# Patient Record
Sex: Male | Born: 1940 | Race: White | Hispanic: No | Marital: Married | Smoking: Never smoker
Health system: Southern US, Community
[De-identification: ages and names within clinical notes are randomized; demographics above are authoritative.]

## PROBLEM LIST (undated history)

## (undated) HISTORY — PX: TONSILLECTOMY: SUR1361

## (undated) HISTORY — PX: SHOULDER SURGERY: SHX246

## (undated) HISTORY — PX: HERNIA REPAIR: SHX51

---

## 2005-08-04 ENCOUNTER — Ambulatory Visit: Payer: Self-pay | Admitting: Pain Medicine

## 2005-10-06 ENCOUNTER — Ambulatory Visit: Payer: Self-pay | Admitting: Pain Medicine

## 2005-10-17 ENCOUNTER — Ambulatory Visit: Payer: Self-pay | Admitting: Pain Medicine

## 2005-10-20 ENCOUNTER — Ambulatory Visit: Payer: Self-pay | Admitting: Pain Medicine

## 2005-11-03 ENCOUNTER — Ambulatory Visit: Payer: Self-pay | Admitting: Pain Medicine

## 2007-11-26 ENCOUNTER — Encounter: Admission: RE | Admit: 2007-11-26 | Discharge: 2007-11-26 | Payer: Self-pay | Admitting: Family Medicine

## 2007-11-26 ENCOUNTER — Encounter (INDEPENDENT_AMBULATORY_CARE_PROVIDER_SITE_OTHER): Payer: Self-pay | Admitting: Radiology

## 2010-10-18 ENCOUNTER — Encounter: Payer: Self-pay | Admitting: Cardiovascular Disease

## 2010-10-18 ENCOUNTER — Ambulatory Visit: Payer: Medicare Other | Admitting: Cardiovascular Disease

## 2010-10-18 ENCOUNTER — Encounter: Payer: Medicare Other | Admitting: Cardiovascular Disease

## 2010-12-13 ENCOUNTER — Other Ambulatory Visit: Payer: Self-pay | Admitting: Neurology

## 2010-12-13 DIAGNOSIS — G733 Myasthenic syndromes in other diseases classified elsewhere: Secondary | ICD-10-CM

## 2010-12-15 ENCOUNTER — Other Ambulatory Visit: Payer: Medicare Other

## 2010-12-15 ENCOUNTER — Ambulatory Visit
Admission: RE | Admit: 2010-12-15 | Discharge: 2010-12-15 | Disposition: A | Discharge status: Home / Self Care | Payer: Medicare Other | Source: Ambulatory Visit | Attending: Neurology | Admitting: Neurology

## 2010-12-15 DIAGNOSIS — G733 Myasthenic syndromes in other diseases classified elsewhere: Secondary | ICD-10-CM

## 2010-12-15 MED ORDER — IOHEXOL 300 MG/ML  SOLN
75.0000 mL | Freq: Once | INTRAMUSCULAR | Status: AC | PRN
  Administered 2010-12-15: 75 mL via INTRAVENOUS

## 2011-05-31 NOTE — Progress Notes (Signed)
Patient ID: Chad Griffith, male   DOB: 10-13-1940, 70 y.o.   MRN: [id number]

## 2012-06-10 IMAGING — CT CT CHEST W/ CM
2 of 3 series · 15 of 36 positions shown, 18 images · IV contrast (75CC OMNI 300)
Comparison: None.

***ADDENDUM*** CREATED: 03/03/2011 [DATE]

BUN and creatinine were obtained on site at [HOSPITAL] at
[HOSPITAL].
Results:  BUN 19 mg/dL,  Creatinine 1.0 mg/dL.
***END ADDENDUM*** SIGNED BY: Mozeylian Ahad Talukder, M.D.
CLINICAL DATA: Myasthenia symptoms.  Question thymoma.  Double
vision.
CT CHEST WITH CONTRAST
TECHNIQUE: Multidetector CT imaging of the chest was performed
following the standard protocol during bolus administration of
intravenous contrast.
Contrast: 75 ml Omnipaque-UYY.

[Series 100: routine chest · axial · 0.70mm/px · z∈[-261,-6]mm · 12 of 61 slices shown, 15 images]
[im 5/61  mediastinal]
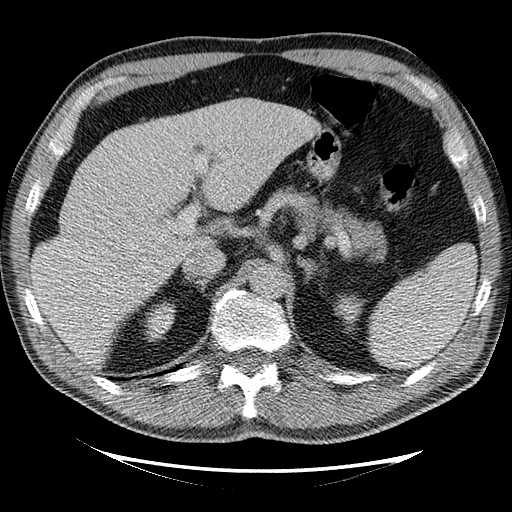
[im 5/61  lung]
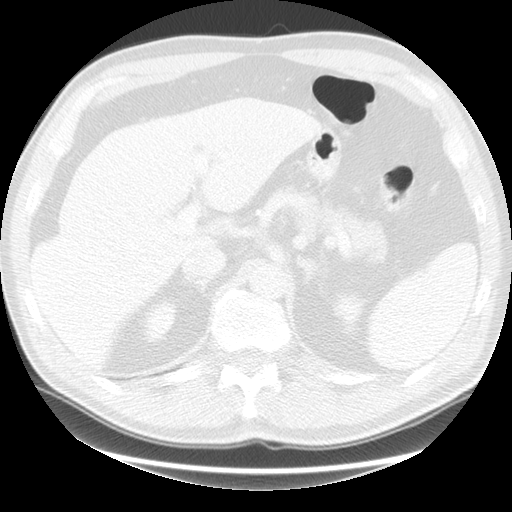
[im 9/61  lung]
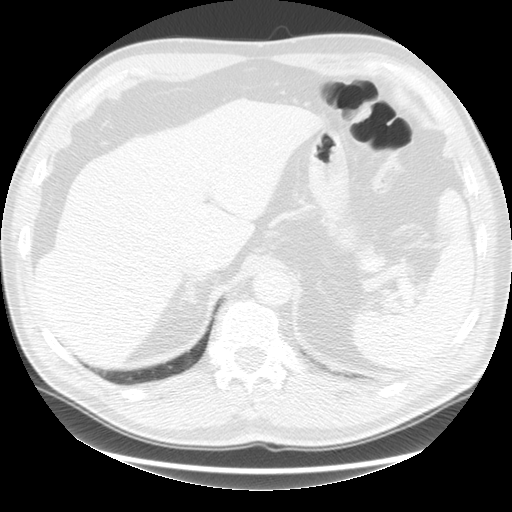
[im 14/61  lung]
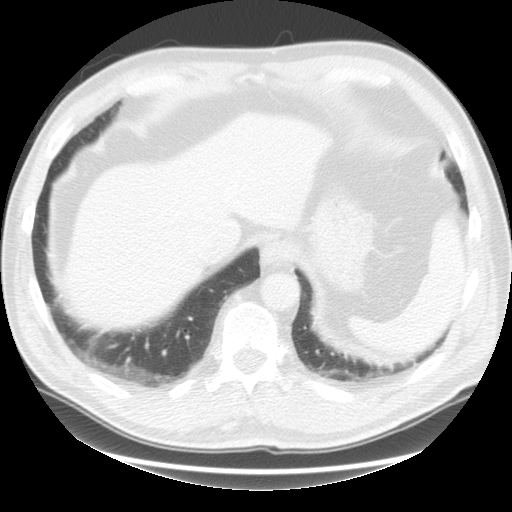
[im 18/61  lung]
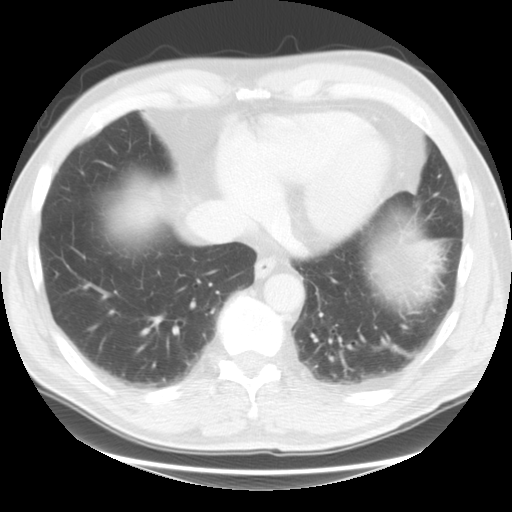
[im 23/61  mediastinal]
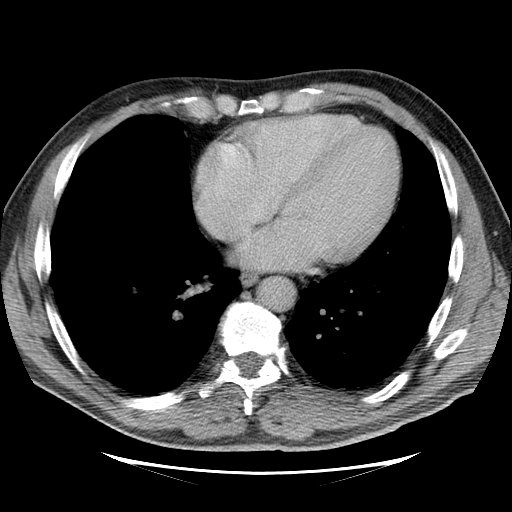
[im 23/61  lung]
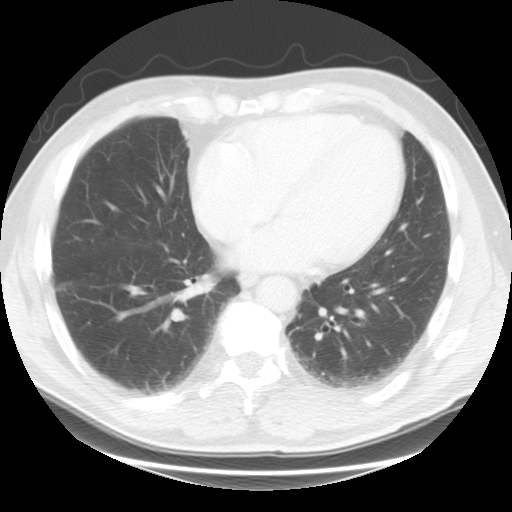
[im 27/61  lung]
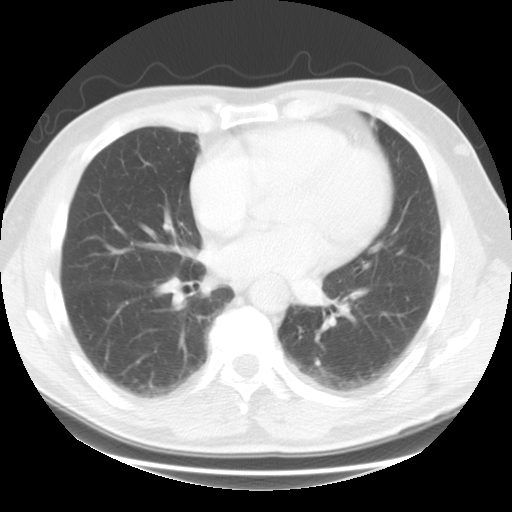
[im 34/61  lung]
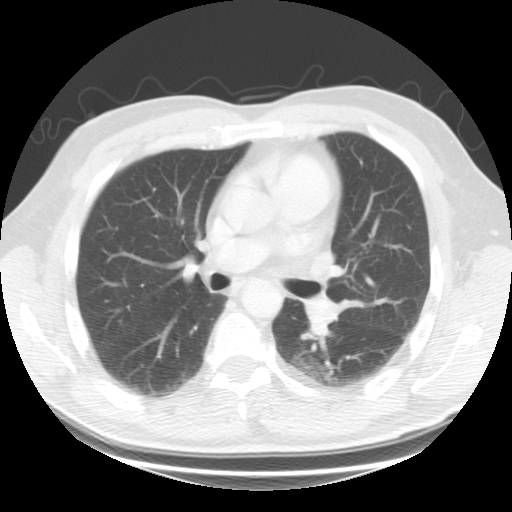
[im 38/61  lung]
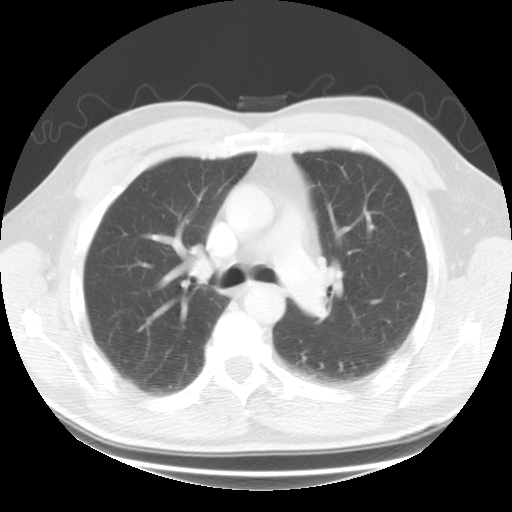
[im 43/61  mediastinal]
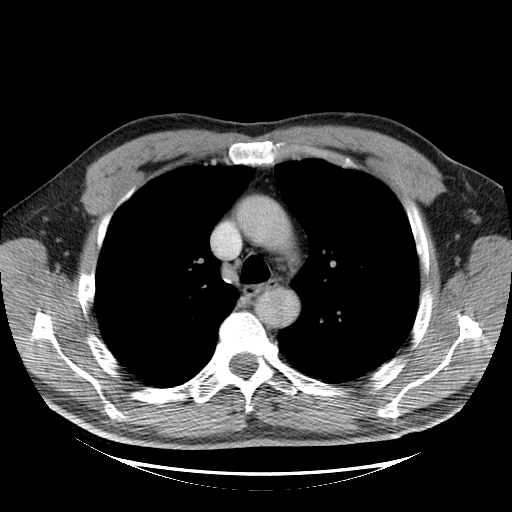
[im 43/61  lung]
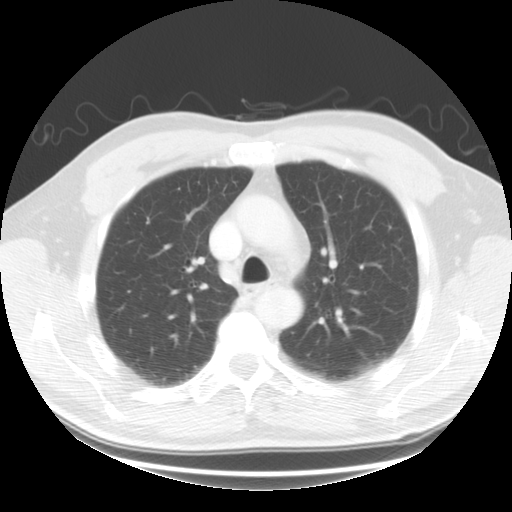
[im 47/61  lung]
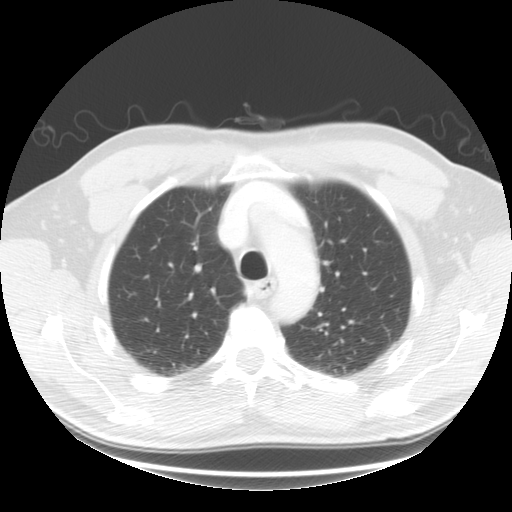
[im 52/61  lung]
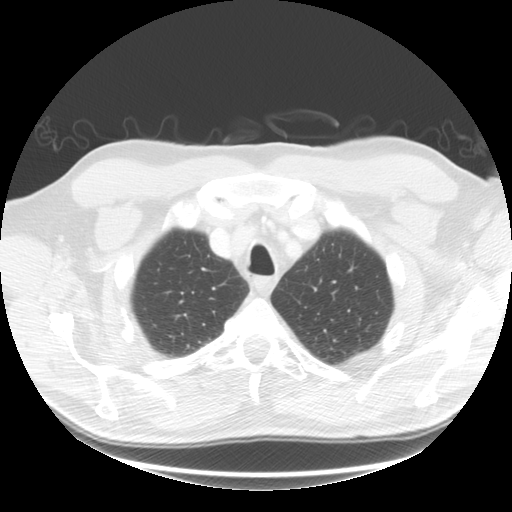
[im 56/61  lung]
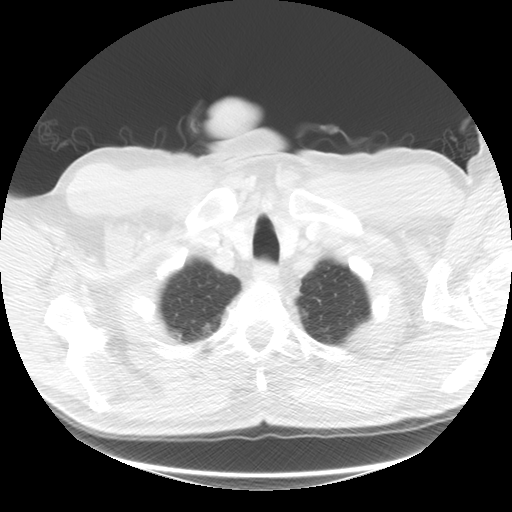

[Series 103: cor · coronal · 0.70mm/px · 3 of 124 slices shown]
[im 25/124  lung]
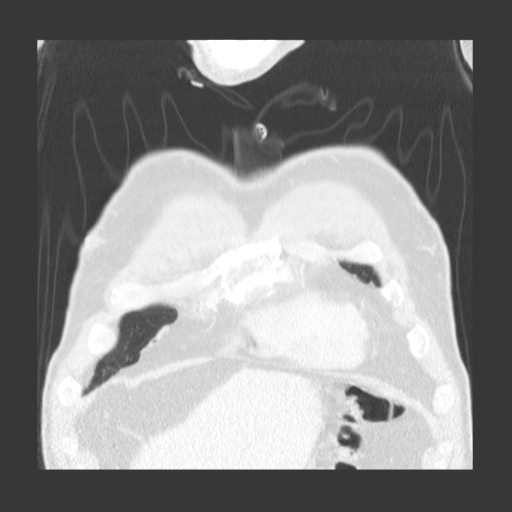
[im 50/124  lung]
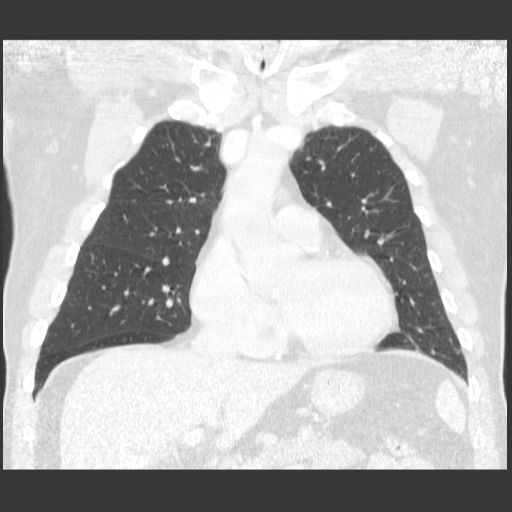
[im 74/124  lung]
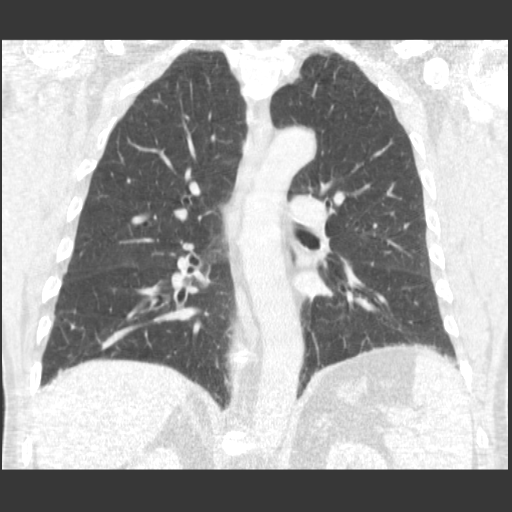

[15 of 36 positions shown; findings below may reference images not displayed]

FINDINGS: No findings of thymoma.

Heart is slightly enlarged. Trace pericardial fluid.  Coronary
artery calcifications.

Structure adjacent to the anterior border of the left main
pulmonary artery probably represents the left atrial appendage with
mild pulsation artifact rather than adenopathy.  Overall, no
evidence of mediastinal or hilar adenopathy.

Scattered pulmonary parenchymal changes suggestive of
scarring/atelectasis.

Within the left chest wall inferior and lateral to the nipple is a
1.2 cm subcutaneous lesion (series 100 image 35).
Etiology/significance indeterminate.

Scoliosis without bony destructive lesion.

Slightly lobulated contour of the liver. Otherwise upper abdominal
structures unremarkable.
IMPRESSION: No evidence of thymoma.

Scattered pulmonary parenchymal changes suggestive of
scarring/subsegmental atelectatic changes.

Mild coronary artery calcifications.  Heart slightly enlarged

Within the left chest wall inferior and lateral to the nipple is a
1.2 cm subcutaneous lesion (series 100 image 35).
Etiology/significance indeterminate.

Scoliosis without bony destructive lesion.

## 2012-12-24 DIAGNOSIS — G7000 Myasthenia gravis without (acute) exacerbation: Secondary | ICD-10-CM | POA: Insufficient documentation

## 2012-12-24 HISTORY — DX: Myasthenia gravis without (acute) exacerbation: G70.00

## 2013-05-10 DIAGNOSIS — K5730 Diverticulosis of large intestine without perforation or abscess without bleeding: Secondary | ICD-10-CM

## 2013-05-10 DIAGNOSIS — H2510 Age-related nuclear cataract, unspecified eye: Secondary | ICD-10-CM

## 2013-05-10 DIAGNOSIS — N320 Bladder-neck obstruction: Secondary | ICD-10-CM

## 2013-05-10 DIAGNOSIS — R39198 Other difficulties with micturition: Secondary | ICD-10-CM | POA: Insufficient documentation

## 2013-05-10 DIAGNOSIS — N401 Enlarged prostate with lower urinary tract symptoms: Secondary | ICD-10-CM

## 2013-05-10 DIAGNOSIS — I498 Other specified cardiac arrhythmias: Secondary | ICD-10-CM | POA: Insufficient documentation

## 2013-05-10 DIAGNOSIS — Z860100 Personal history of colon polyps, unspecified: Secondary | ICD-10-CM

## 2013-05-10 HISTORY — DX: Other specified cardiac arrhythmias: I49.8

## 2013-05-10 HISTORY — DX: Other difficulties with micturition: R39.198

## 2013-05-10 HISTORY — DX: Bladder-neck obstruction: N32.0

## 2013-05-10 HISTORY — DX: Diverticulosis of large intestine without perforation or abscess without bleeding: K57.30

## 2013-05-10 HISTORY — DX: Benign prostatic hyperplasia with lower urinary tract symptoms: N40.1

## 2013-05-10 HISTORY — DX: Personal history of colon polyps, unspecified: Z86.0100

## 2013-05-10 HISTORY — DX: Age-related nuclear cataract, unspecified eye: H25.10

## 2014-03-24 DIAGNOSIS — Z862 Personal history of diseases of the blood and blood-forming organs and certain disorders involving the immune mechanism: Secondary | ICD-10-CM

## 2014-03-24 DIAGNOSIS — M62121 Other rupture of muscle (nontraumatic), right upper arm: Secondary | ICD-10-CM

## 2014-03-24 DIAGNOSIS — Z5181 Encounter for therapeutic drug level monitoring: Secondary | ICD-10-CM | POA: Insufficient documentation

## 2014-03-24 DIAGNOSIS — Z79631 Long term (current) use of antimetabolite agent: Secondary | ICD-10-CM

## 2014-03-24 HISTORY — DX: Personal history of diseases of the blood and blood-forming organs and certain disorders involving the immune mechanism: Z86.2

## 2014-03-24 HISTORY — DX: Long term (current) use of antimetabolite agent: Z79.631

## 2014-03-24 HISTORY — DX: Other rupture of muscle (nontraumatic), right upper arm: M62.121

## 2014-03-29 DIAGNOSIS — E538 Deficiency of other specified B group vitamins: Secondary | ICD-10-CM

## 2014-03-29 HISTORY — DX: Deficiency of other specified B group vitamins: E53.8

## 2016-09-23 DIAGNOSIS — Z7982 Long term (current) use of aspirin: Secondary | ICD-10-CM | POA: Diagnosis not present

## 2016-09-23 DIAGNOSIS — G7000 Myasthenia gravis without (acute) exacerbation: Secondary | ICD-10-CM | POA: Diagnosis not present

## 2016-09-23 DIAGNOSIS — H532 Diplopia: Secondary | ICD-10-CM | POA: Diagnosis not present

## 2016-09-23 DIAGNOSIS — Z79899 Other long term (current) drug therapy: Secondary | ICD-10-CM | POA: Diagnosis not present

## 2016-09-23 DIAGNOSIS — Z5181 Encounter for therapeutic drug level monitoring: Secondary | ICD-10-CM | POA: Diagnosis not present

## 2016-09-23 DIAGNOSIS — E538 Deficiency of other specified B group vitamins: Secondary | ICD-10-CM | POA: Diagnosis not present

## 2017-02-03 DIAGNOSIS — R8299 Other abnormal findings in urine: Secondary | ICD-10-CM | POA: Diagnosis not present

## 2017-02-03 DIAGNOSIS — E785 Hyperlipidemia, unspecified: Secondary | ICD-10-CM | POA: Diagnosis not present

## 2017-02-03 DIAGNOSIS — Z7982 Long term (current) use of aspirin: Secondary | ICD-10-CM | POA: Diagnosis not present

## 2017-02-03 DIAGNOSIS — R112 Nausea with vomiting, unspecified: Secondary | ICD-10-CM | POA: Diagnosis not present

## 2017-02-03 DIAGNOSIS — N201 Calculus of ureter: Secondary | ICD-10-CM | POA: Diagnosis not present

## 2017-02-03 DIAGNOSIS — R1110 Vomiting, unspecified: Secondary | ICD-10-CM | POA: Diagnosis not present

## 2017-02-03 DIAGNOSIS — R1032 Left lower quadrant pain: Secondary | ICD-10-CM | POA: Diagnosis not present

## 2017-02-05 DIAGNOSIS — E785 Hyperlipidemia, unspecified: Secondary | ICD-10-CM | POA: Diagnosis not present

## 2017-02-05 DIAGNOSIS — G7000 Myasthenia gravis without (acute) exacerbation: Secondary | ICD-10-CM | POA: Diagnosis not present

## 2017-02-05 DIAGNOSIS — N200 Calculus of kidney: Secondary | ICD-10-CM | POA: Diagnosis not present

## 2017-02-05 DIAGNOSIS — R1032 Left lower quadrant pain: Secondary | ICD-10-CM | POA: Diagnosis not present

## 2017-02-05 DIAGNOSIS — N202 Calculus of kidney with calculus of ureter: Secondary | ICD-10-CM | POA: Diagnosis not present

## 2017-02-05 DIAGNOSIS — N201 Calculus of ureter: Secondary | ICD-10-CM | POA: Diagnosis not present

## 2017-02-05 DIAGNOSIS — N179 Acute kidney failure, unspecified: Secondary | ICD-10-CM | POA: Diagnosis not present

## 2017-02-05 DIAGNOSIS — K5900 Constipation, unspecified: Secondary | ICD-10-CM | POA: Diagnosis not present

## 2017-02-06 DIAGNOSIS — N200 Calculus of kidney: Secondary | ICD-10-CM | POA: Diagnosis not present

## 2017-02-06 DIAGNOSIS — R319 Hematuria, unspecified: Secondary | ICD-10-CM | POA: Diagnosis not present

## 2017-02-06 DIAGNOSIS — N23 Unspecified renal colic: Secondary | ICD-10-CM | POA: Diagnosis not present

## 2017-02-06 DIAGNOSIS — Z6831 Body mass index (BMI) 31.0-31.9, adult: Secondary | ICD-10-CM | POA: Diagnosis not present

## 2017-02-06 DIAGNOSIS — N529 Male erectile dysfunction, unspecified: Secondary | ICD-10-CM | POA: Diagnosis not present

## 2017-02-06 DIAGNOSIS — M549 Dorsalgia, unspecified: Secondary | ICD-10-CM | POA: Diagnosis not present

## 2017-02-06 DIAGNOSIS — K8020 Calculus of gallbladder without cholecystitis without obstruction: Secondary | ICD-10-CM | POA: Diagnosis not present

## 2017-02-06 DIAGNOSIS — R310 Gross hematuria: Secondary | ICD-10-CM | POA: Diagnosis not present

## 2017-02-08 DIAGNOSIS — N200 Calculus of kidney: Secondary | ICD-10-CM | POA: Diagnosis not present

## 2017-02-08 DIAGNOSIS — R1032 Left lower quadrant pain: Secondary | ICD-10-CM | POA: Diagnosis not present

## 2017-02-08 DIAGNOSIS — N401 Enlarged prostate with lower urinary tract symptoms: Secondary | ICD-10-CM | POA: Diagnosis not present

## 2017-02-09 DIAGNOSIS — N200 Calculus of kidney: Secondary | ICD-10-CM | POA: Diagnosis not present

## 2017-04-19 DIAGNOSIS — E7800 Pure hypercholesterolemia, unspecified: Secondary | ICD-10-CM | POA: Diagnosis not present

## 2017-04-19 DIAGNOSIS — Z23 Encounter for immunization: Secondary | ICD-10-CM | POA: Diagnosis not present

## 2017-04-19 DIAGNOSIS — Z7689 Persons encountering health services in other specified circumstances: Secondary | ICD-10-CM | POA: Diagnosis not present

## 2017-04-19 DIAGNOSIS — G7000 Myasthenia gravis without (acute) exacerbation: Secondary | ICD-10-CM | POA: Diagnosis not present

## 2017-09-01 DIAGNOSIS — Z7982 Long term (current) use of aspirin: Secondary | ICD-10-CM | POA: Diagnosis not present

## 2017-09-01 DIAGNOSIS — E7800 Pure hypercholesterolemia, unspecified: Secondary | ICD-10-CM | POA: Diagnosis not present

## 2017-09-07 DIAGNOSIS — R221 Localized swelling, mass and lump, neck: Secondary | ICD-10-CM | POA: Diagnosis not present

## 2017-09-07 DIAGNOSIS — L720 Epidermal cyst: Secondary | ICD-10-CM | POA: Diagnosis not present

## 2017-09-22 DIAGNOSIS — L720 Epidermal cyst: Secondary | ICD-10-CM | POA: Diagnosis not present

## 2017-09-22 DIAGNOSIS — L578 Other skin changes due to chronic exposure to nonionizing radiation: Secondary | ICD-10-CM | POA: Diagnosis not present

## 2017-09-22 DIAGNOSIS — I499 Cardiac arrhythmia, unspecified: Secondary | ICD-10-CM | POA: Diagnosis not present

## 2017-09-22 DIAGNOSIS — Z7982 Long term (current) use of aspirin: Secondary | ICD-10-CM | POA: Diagnosis not present

## 2017-09-22 DIAGNOSIS — E785 Hyperlipidemia, unspecified: Secondary | ICD-10-CM | POA: Diagnosis not present

## 2017-09-22 DIAGNOSIS — R221 Localized swelling, mass and lump, neck: Secondary | ICD-10-CM | POA: Diagnosis not present

## 2017-09-22 DIAGNOSIS — G7000 Myasthenia gravis without (acute) exacerbation: Secondary | ICD-10-CM | POA: Diagnosis not present

## 2017-10-27 DIAGNOSIS — Z7982 Long term (current) use of aspirin: Secondary | ICD-10-CM | POA: Diagnosis not present

## 2017-10-27 DIAGNOSIS — Z79899 Other long term (current) drug therapy: Secondary | ICD-10-CM | POA: Diagnosis not present

## 2017-10-27 DIAGNOSIS — G7000 Myasthenia gravis without (acute) exacerbation: Secondary | ICD-10-CM | POA: Diagnosis not present

## 2017-10-27 DIAGNOSIS — E538 Deficiency of other specified B group vitamins: Secondary | ICD-10-CM | POA: Diagnosis not present

## 2017-10-27 DIAGNOSIS — Z5181 Encounter for therapeutic drug level monitoring: Secondary | ICD-10-CM | POA: Diagnosis not present

## 2017-11-10 DIAGNOSIS — Z0000 Encounter for general adult medical examination without abnormal findings: Secondary | ICD-10-CM | POA: Diagnosis not present

## 2018-03-29 DIAGNOSIS — G7000 Myasthenia gravis without (acute) exacerbation: Secondary | ICD-10-CM | POA: Diagnosis not present

## 2018-03-29 DIAGNOSIS — Z7982 Long term (current) use of aspirin: Secondary | ICD-10-CM | POA: Diagnosis not present

## 2018-03-29 DIAGNOSIS — Z9841 Cataract extraction status, right eye: Secondary | ICD-10-CM | POA: Diagnosis not present

## 2018-03-29 DIAGNOSIS — Z79899 Other long term (current) drug therapy: Secondary | ICD-10-CM | POA: Diagnosis not present

## 2018-03-29 DIAGNOSIS — Z79890 Hormone replacement therapy (postmenopausal): Secondary | ICD-10-CM | POA: Diagnosis not present

## 2018-03-29 DIAGNOSIS — Z9842 Cataract extraction status, left eye: Secondary | ICD-10-CM | POA: Diagnosis not present

## 2018-03-29 DIAGNOSIS — Z5181 Encounter for therapeutic drug level monitoring: Secondary | ICD-10-CM | POA: Diagnosis not present

## 2018-03-29 DIAGNOSIS — E785 Hyperlipidemia, unspecified: Secondary | ICD-10-CM | POA: Diagnosis not present

## 2018-03-29 DIAGNOSIS — H532 Diplopia: Secondary | ICD-10-CM | POA: Diagnosis not present

## 2018-05-31 DIAGNOSIS — H9110 Presbycusis, unspecified ear: Secondary | ICD-10-CM | POA: Diagnosis not present

## 2018-05-31 DIAGNOSIS — H9319 Tinnitus, unspecified ear: Secondary | ICD-10-CM | POA: Diagnosis not present

## 2018-05-31 DIAGNOSIS — R42 Dizziness and giddiness: Secondary | ICD-10-CM | POA: Diagnosis not present

## 2018-10-19 DIAGNOSIS — H60502 Unspecified acute noninfective otitis externa, left ear: Secondary | ICD-10-CM | POA: Diagnosis not present

## 2018-10-19 DIAGNOSIS — T162XXA Foreign body in left ear, initial encounter: Secondary | ICD-10-CM | POA: Diagnosis not present

## 2018-11-19 DIAGNOSIS — Z6828 Body mass index (BMI) 28.0-28.9, adult: Secondary | ICD-10-CM | POA: Diagnosis not present

## 2018-11-19 DIAGNOSIS — Z1339 Encounter for screening examination for other mental health and behavioral disorders: Secondary | ICD-10-CM | POA: Diagnosis not present

## 2018-11-19 DIAGNOSIS — Z0000 Encounter for general adult medical examination without abnormal findings: Secondary | ICD-10-CM | POA: Diagnosis not present

## 2018-11-19 DIAGNOSIS — Z1331 Encounter for screening for depression: Secondary | ICD-10-CM | POA: Diagnosis not present

## 2018-11-26 DIAGNOSIS — I447 Left bundle-branch block, unspecified: Secondary | ICD-10-CM | POA: Diagnosis not present

## 2018-11-26 DIAGNOSIS — R001 Bradycardia, unspecified: Secondary | ICD-10-CM | POA: Diagnosis not present

## 2018-11-27 DIAGNOSIS — Z79899 Other long term (current) drug therapy: Secondary | ICD-10-CM | POA: Diagnosis not present

## 2018-11-27 DIAGNOSIS — L820 Inflamed seborrheic keratosis: Secondary | ICD-10-CM | POA: Diagnosis not present

## 2018-11-27 DIAGNOSIS — G7000 Myasthenia gravis without (acute) exacerbation: Secondary | ICD-10-CM | POA: Diagnosis not present

## 2018-11-27 DIAGNOSIS — D225 Melanocytic nevi of trunk: Secondary | ICD-10-CM | POA: Diagnosis not present

## 2018-12-04 DIAGNOSIS — I447 Left bundle-branch block, unspecified: Secondary | ICD-10-CM

## 2018-12-04 HISTORY — DX: Left bundle-branch block, unspecified: I44.7

## 2018-12-06 DIAGNOSIS — E785 Hyperlipidemia, unspecified: Secondary | ICD-10-CM

## 2018-12-06 HISTORY — DX: Hyperlipidemia, unspecified: E78.5

## 2018-12-06 NOTE — Progress Notes (Signed)
Cardiology Office Note:    Date:  12/07/2018   ID:  Chad Griffith, DOB 1940/10/19, MRN [id number]  PCP:  Richardo Priest, MD  Cardiologist:  Shirlee More, MD   Referring MD: Noreene Larsson, FNP  ASSESSMENT:    1. LBBB (left bundle branch block)   2. Mixed hyperlipidemia    PLAN:    In order of problems listed above:  1. He has a chronic pattern of left bundle branch block, at times this is a marker severe left ventricular dysfunction he has no evidence of heart failure but I asked him undergo an echocardiogram if present we change his evaluation and treatment.  If function is normal I would see back in the office as needed.  Clinically I do not feel he has underlying cardiomyopathy present. 2. Hyperlipidemia stable continue a statin his lipids are at target goal 3. Elevated blood pressure without history of hypertension I asked him to get a arm cuff to sequential BPs and if he is consistently greater than 557 systolic would need antihypertensive therapy.  Next appointment as needed depending on the results of his echocardiogram   Medication Adjustments/Labs and Tests Ordered: Current medicines are reviewed at length with the patient today.  Concerns regarding medicines are outlined above.  Orders Placed This Encounter  Procedures  . EKG 12-Lead  . ECHOCARDIOGRAM COMPLETE   No orders of the defined types were placed in this encounter.    Chief Complaint  Patient presents with  . Abnormal ECG    LBBB    History of Present Illness:    Chad Griffith is a 78 y.o. male with myasthenia gravis who is being seen today for the evaluation of left bundle branch block found at office visit 11/19/2018 at the request of Noreene Larsson, FNP. I was able to locate record at [facility] 2018 he had a similar EKG pattern.  Our [facility] identified a prescription from 2015 for amiodarone I looked through the [facility] records is not document he ever took it the patient denies any knowledge of  arrhythmia heart disease shortness of breath heart failure valvular heart disease heart murmur.  He is not having exercise intolerance edema dyspnea chest pain palpitation or syncope.  Unfortunately has been checking his blood pressure with a wrist cuff.  Past Medical History:  Diagnosis Date  . LBBB (left bundle branch block) 12/04/2018    Past Surgical History:  Procedure Laterality Date  . HERNIA REPAIR    . SHOULDER SURGERY Right   . TONSILLECTOMY      Current Medications: Current Meds  Medication Sig  . aspirin 81 MG tablet Take 81 mg by mouth daily.    . folic acid (FOLVITE) 322 MCG tablet Take 400 mcg by mouth daily.  . methotrexate (RHEUMATREX) 2.5 MG tablet Take 4 tablets by mouth once a week.  . Multiple Vitamin (MULTIVITAMIN) tablet Take 1 tablet by mouth daily.  . pravastatin (PRAVACHOL) 40 MG tablet Take 40 mg by mouth daily.  . vitamin B-12 (CYANOCOBALAMIN) 1000 MCG tablet Take 1,000 mcg by mouth daily.      Allergies:   Patient has no known allergies.   Social History   Socioeconomic History  . Marital status: Unknown    Spouse name: Not on file  . Number of children: 2  . Years of education: Not on file  . Highest education level: Not on file  Occupational History  . Occupation: retired  Scientific laboratory technician  . Financial resource strain:  Not on file  . Food insecurity    Worry: Not on file    Inability: Not on file  . Transportation needs    Medical: Not on file    Non-medical: Not on file  Tobacco Use  . Smoking status: Former Smoker    Types: Cigarettes    Quit date: 06/20/1948    Years since quitting: 70.5  . Smokeless tobacco: Never Used  . Tobacco comment: smoked in high school  Substance and Sexual Activity  . Alcohol use: No  . Drug use: No  . Sexual activity: Not on file  Lifestyle  . Physical activity    Days per week: Not on file    Minutes per session: Not on file  . Stress: Not on file  Relationships  . Social Musicianconnections    Talks on  phone: Not on file    Gets together: Not on file    Attends religious service: Not on file    Active member of club or organization: Not on file    Attends meetings of clubs or organizations: Not on file    Relationship status: Not on file  Other Topics Concern  . Not on file  Social History Narrative  . Not on file     Family History: The patient's family history includes Brain cancer in his mother; CAD in his father; Heart attack in his brother; Stroke in his brother, brother, and brother.  ROS:   Review of Systems  Constitution: Positive for malaise/fatigue.  HENT: Negative.   Eyes: Negative.   Cardiovascular: Negative.   Respiratory: Negative.   Endocrine: Negative.   Hematologic/Lymphatic: Negative.   Skin: Negative.   Musculoskeletal: Negative.   Gastrointestinal: Negative.   Genitourinary: Negative.   Neurological: Positive for weakness.  Psychiatric/Behavioral: Negative.   Allergic/Immunologic: Negative.    Please see the history of present illness.     All other systems reviewed and are negative.  EKGs/Labs/Other Studies Reviewed:    The following studies were reviewed today:   EKG:  EKG is  ordered today.  The ekg ordered today is personally reviewed and demonstrates sinus rhythm left bundle branch block relatively narrow QRS interval 152 ms rate 54 bpm  Recent Labs: 11/19/2018 CMP normal except for calcium mildly elevated 10.5 GFR 1.1 creatinine GFR greater than 60 Cholesterol 161 triglycerides 134 HDL 46 LDL 88 note he is on pravastatin No results found for requested labs within last 8760 hours.  Recent Lipid Panel No results found for: CHOL, TRIG, HDL, CHOLHDL, VLDL, LDLCALC, LDLDIRECT  Physical Exam:    VS:  BP (!) 152/82 (BP Location: Left Arm, Patient Position: Sitting, Cuff Size: Normal)   Pulse (!) 54   Temp 98.2 F (36.8 C)   Ht 5\' 11"  (1.803 m)   Wt 196 lb (88.9 kg)   SpO2 97%   BMI 27.34 kg/m     Wt Readings from Last 3 Encounters:   12/07/18 196 lb (88.9 kg)     GEN:  Well nourished, well developed in no acute distress HEENT: Normal NECK: No JVD; No carotid bruits LYMPHATICS: No lymphadenopathy CARDIAC: RRR, no murmurs, rubs, gallops RESPIRATORY:  Clear to auscultation without rales, wheezing or rhonchi  ABDOMEN: Soft, non-tender, non-distended MUSCULOSKELETAL:  No edema; No deformity  SKIN: Warm and dry NEUROLOGIC:  Alert and oriented x 3 PSYCHIATRIC:  Normal affect     Signed, Norman HerrlichBrian Munley, MD  12/07/2018 11:50 AM     [facility]

## 2018-12-07 ENCOUNTER — Other Ambulatory Visit: Payer: Self-pay

## 2018-12-07 ENCOUNTER — Ambulatory Visit (INDEPENDENT_AMBULATORY_CARE_PROVIDER_SITE_OTHER): Payer: Medicare Other | Admitting: Cardiology

## 2018-12-07 ENCOUNTER — Encounter: Payer: Self-pay | Admitting: Cardiology

## 2018-12-07 VITALS — BP 152/82 | HR 54 | Temp 98.2°F | Ht 71.0 in | Wt 196.0 lb

## 2018-12-07 DIAGNOSIS — I447 Left bundle-branch block, unspecified: Secondary | ICD-10-CM | POA: Diagnosis not present

## 2018-12-07 DIAGNOSIS — E782 Mixed hyperlipidemia: Secondary | ICD-10-CM

## 2018-12-07 NOTE — Patient Instructions (Signed)
Medication Instructions:  Your physician recommends that you continue on your current medications as directed. Please refer to the Current Medication list given to you today.  If you need a refill on your cardiac medications before your next appointment, please call your pharmacy.   Lab work: None  If you have labs (blood work) drawn today and your tests are completely normal, you will receive your results only by: . MyChart Message (if you have MyChart) OR . A paper copy in the mail If you have any lab test that is abnormal or we need to change your treatment, we will call you to review the results.  Testing/Procedures: You had an EKG today.   Your physician has requested that you have an echocardiogram. Echocardiography is a painless test that uses sound waves to create images of your heart. It provides your doctor with information about the size and shape of your heart and how well your heart's chambers and valves are working. This procedure takes approximately one hour. There are no restrictions for this procedure.    Follow-Up: At [facility], you and your health needs are our priority.  As part of our continuing mission to provide you with exceptional heart care, we have created designated Provider Care Teams.  These Care Teams include your primary Cardiologist (physician) and Advanced Practice Providers (APPs -  Physician Assistants and Nurse Practitioners) who all work together to provide you with the care you need, when you need it. You will need a follow up appointment as needed if symptoms worsen or fail to improve.       Echocardiogram An echocardiogram is a procedure that uses painless sound waves (ultrasound) to produce an image of the heart. Images from an echocardiogram can provide important information about:  Signs of coronary artery disease (CAD).  Aneurysm detection. An aneurysm is a weak or damaged part of an artery wall that bulges out from the normal force  of blood pumping through the body.  Heart size and shape. Changes in the size or shape of the heart can be associated with certain conditions, including heart failure, aneurysm, and CAD.  Heart muscle function.  Heart valve function.  Signs of a past heart attack.  Fluid buildup around the heart.  Thickening of the heart muscle.  A tumor or infectious growth around the heart valves. Tell a health care provider about:  Any allergies you have.  All medicines you are taking, including vitamins, herbs, eye drops, creams, and over-the-counter medicines.  Any blood disorders you have.  Any surgeries you have had.  Any medical conditions you have.  Whether you are pregnant or may be pregnant. What are the risks? Generally, this is a safe procedure. However, problems may occur, including:  Allergic reaction to dye (contrast) that may be used during the procedure. What happens before the procedure? No specific preparation is needed. You may eat and drink normally. What happens during the procedure?   An IV tube may be inserted into one of your veins.  You may receive contrast through this tube. A contrast is an injection that improves the quality of the pictures from your heart.  A gel will be applied to your chest.  A wand-like tool (transducer) will be moved over your chest. The gel will help to transmit the sound waves from the transducer.  The sound waves will harmlessly bounce off of your heart to allow the heart images to be captured in real-time motion. The images will be recorded on   a computer. The procedure may vary among health care providers and hospitals. What happens after the procedure?  You may return to your normal, everyday life, including diet, activities, and medicines, unless your health care provider tells you not to do that. Summary  An echocardiogram is a procedure that uses painless sound waves (ultrasound) to produce an image of the heart.  Images  from an echocardiogram can provide important information about the size and shape of your heart, heart muscle function, heart valve function, and fluid buildup around your heart.  You do not need to do anything to prepare before this procedure. You may eat and drink normally.  After the echocardiogram is completed, you may return to your normal, everyday life, unless your health care provider tells you not to do that. This information is not intended to replace advice given to you by your health care provider. Make sure you discuss any questions you have with your health care provider. Document Released: 06/03/2000 Document Revised: 07/09/2016 Document Reviewed: 07/09/2016 Elsevier Interactive Patient Education  2019 Elsevier Inc.    

## 2019-01-18 ENCOUNTER — Other Ambulatory Visit: Payer: Self-pay

## 2019-01-18 ENCOUNTER — Ambulatory Visit (INDEPENDENT_AMBULATORY_CARE_PROVIDER_SITE_OTHER): Payer: Medicare Other

## 2019-01-18 DIAGNOSIS — I447 Left bundle-branch block, unspecified: Secondary | ICD-10-CM | POA: Diagnosis not present

## 2019-01-18 NOTE — Progress Notes (Signed)
Complete echocardiogram has been performed.  Jimmy Ailea Rhatigan RDCS, RVT 

## 2019-09-12 DIAGNOSIS — Z79899 Other long term (current) drug therapy: Secondary | ICD-10-CM | POA: Diagnosis not present

## 2019-09-12 DIAGNOSIS — G7000 Myasthenia gravis without (acute) exacerbation: Secondary | ICD-10-CM | POA: Diagnosis not present

## 2019-09-12 DIAGNOSIS — Z5181 Encounter for therapeutic drug level monitoring: Secondary | ICD-10-CM | POA: Diagnosis not present

## 2020-01-07 DIAGNOSIS — I10 Essential (primary) hypertension: Secondary | ICD-10-CM | POA: Diagnosis not present

## 2020-01-07 DIAGNOSIS — Z7189 Other specified counseling: Secondary | ICD-10-CM | POA: Diagnosis not present

## 2020-01-07 DIAGNOSIS — Z125 Encounter for screening for malignant neoplasm of prostate: Secondary | ICD-10-CM | POA: Diagnosis not present

## 2020-01-07 DIAGNOSIS — E782 Mixed hyperlipidemia: Secondary | ICD-10-CM | POA: Diagnosis not present

## 2020-01-22 DIAGNOSIS — L820 Inflamed seborrheic keratosis: Secondary | ICD-10-CM | POA: Diagnosis not present

## 2020-01-22 DIAGNOSIS — L989 Disorder of the skin and subcutaneous tissue, unspecified: Secondary | ICD-10-CM | POA: Diagnosis not present

## 2020-01-22 DIAGNOSIS — Z6827 Body mass index (BMI) 27.0-27.9, adult: Secondary | ICD-10-CM | POA: Diagnosis not present

## 2020-01-22 DIAGNOSIS — I10 Essential (primary) hypertension: Secondary | ICD-10-CM | POA: Diagnosis not present

## 2020-03-23 DIAGNOSIS — I10 Essential (primary) hypertension: Secondary | ICD-10-CM | POA: Diagnosis not present

## 2020-03-23 DIAGNOSIS — E663 Overweight: Secondary | ICD-10-CM | POA: Diagnosis not present

## 2020-03-23 DIAGNOSIS — L700 Acne vulgaris: Secondary | ICD-10-CM | POA: Diagnosis not present

## 2020-03-23 DIAGNOSIS — E782 Mixed hyperlipidemia: Secondary | ICD-10-CM | POA: Diagnosis not present

## 2020-03-26 DIAGNOSIS — D485 Neoplasm of uncertain behavior of skin: Secondary | ICD-10-CM | POA: Diagnosis not present

## 2020-03-26 DIAGNOSIS — L821 Other seborrheic keratosis: Secondary | ICD-10-CM | POA: Diagnosis not present

## 2020-05-27 DIAGNOSIS — C44319 Basal cell carcinoma of skin of other parts of face: Secondary | ICD-10-CM | POA: Diagnosis not present

## 2020-07-14 DIAGNOSIS — H11152 Pinguecula, left eye: Secondary | ICD-10-CM | POA: Diagnosis not present

## 2020-07-14 DIAGNOSIS — H11022 Central pterygium of left eye: Secondary | ICD-10-CM | POA: Diagnosis not present

## 2020-07-14 DIAGNOSIS — H11423 Conjunctival edema, bilateral: Secondary | ICD-10-CM | POA: Diagnosis not present

## 2020-11-10 DIAGNOSIS — Z5181 Encounter for therapeutic drug level monitoring: Secondary | ICD-10-CM | POA: Diagnosis not present

## 2020-11-10 DIAGNOSIS — Z79899 Other long term (current) drug therapy: Secondary | ICD-10-CM | POA: Diagnosis not present

## 2020-11-10 DIAGNOSIS — G7000 Myasthenia gravis without (acute) exacerbation: Secondary | ICD-10-CM | POA: Diagnosis not present

## 2020-11-12 DIAGNOSIS — G7000 Myasthenia gravis without (acute) exacerbation: Secondary | ICD-10-CM | POA: Diagnosis not present

## 2020-11-12 DIAGNOSIS — E782 Mixed hyperlipidemia: Secondary | ICD-10-CM | POA: Diagnosis not present

## 2020-11-12 DIAGNOSIS — E663 Overweight: Secondary | ICD-10-CM | POA: Diagnosis not present

## 2020-11-12 DIAGNOSIS — I10 Essential (primary) hypertension: Secondary | ICD-10-CM | POA: Diagnosis not present

## 2020-11-19 DIAGNOSIS — E782 Mixed hyperlipidemia: Secondary | ICD-10-CM | POA: Diagnosis not present

## 2020-11-19 DIAGNOSIS — E663 Overweight: Secondary | ICD-10-CM | POA: Diagnosis not present

## 2020-11-19 DIAGNOSIS — R001 Bradycardia, unspecified: Secondary | ICD-10-CM | POA: Diagnosis not present

## 2020-11-19 DIAGNOSIS — I10 Essential (primary) hypertension: Secondary | ICD-10-CM | POA: Diagnosis not present

## 2020-11-25 NOTE — Progress Notes (Signed)
Cardiology Office Note:    Date:  11/26/2020   ID:  Chad Griffith, DOB January 22, 1941, MRN [id number]  PCP:  [facility]  Cardiologist:  Norman Herrlich, MD    Referring MD: Baldo Daub, MD    ASSESSMENT:    1. LBBB (left bundle branch block)   2. Mixed hyperlipidemia   3. Erectile dysfunction, unspecified erectile dysfunction type    PLAN:    In order of problems listed above:  He has left bundle branch block and echocardiogram performed 2 years ago showed no findings of cardiomyopathy he is having no cardiovascular symptoms at this time I do not think he requires repeat echo or ischemia evaluation. Stable continue pravastatin I see no contraindication for him to take either Cialis or Viagra for ED Continue his ARB blood pressure at target   Next appointment: As needed   Medication Adjustments/Labs and Tests Ordered: Current medicines are reviewed at length with the patient today.  Concerns regarding medicines are outlined above.  Orders Placed This Encounter  Procedures   EKG 12-Lead   No orders of the defined types were placed in this encounter.   Chief Complaint  Patient presents with   Follow-up    History of Present Illness:    Chad Griffith is a 80 y.o. male with a hx of myasthenia gravis left bundle branch block elevated blood pressure without a history of hypertension last seen 12/07/2018.  Echocardiogram performed 01/18/2019 showed normal left ventricular size wall thickness systolic function EF 55 to 60% and normal filling pressures.  The right ventricle was normal and there is no significant valvular abnormality. Compliance with diet, lifestyle and medications: Yes  He is interested in pharmacologic therapy for erectile dysfunction. His wife is concerned he needs a repeat echocardiogram with his left bundle branch block He has no evidence of underlying cardiomyopathy. He remains vigorous and active no exercise intolerance palpitations  syncope edema shortness of breath or chest pain. He has no contraindication to pharmacologic therapy for ADD. Recent labs 11/12/2020: TSH 4.57 potassium 4.7 creatinine 1.2 hemoglobin 1.6 cholesterol 161 LDL 86 triglycerides 165 A1c 5.4% Past Medical History:  Diagnosis Date   LBBB (left bundle branch block) 12/04/2018    Past Surgical History:  Procedure Laterality Date   HERNIA REPAIR     SHOULDER SURGERY Right    TONSILLECTOMY      Current Medications: Current Meds  Medication Sig   aspirin 81 MG tablet Take 81 mg by mouth daily.     folic acid (FOLVITE) 800 MCG tablet Take 400 mcg by mouth daily.   olmesartan (BENICAR) 20 MG tablet Take 20 mg by mouth daily.   pravastatin (PRAVACHOL) 40 MG tablet Take 40 mg by mouth daily.   vitamin B-12 (CYANOCOBALAMIN) 1000 MCG tablet Take 1,000 mcg by mouth daily.      Allergies:   Patient has no known allergies.   Social History   Socioeconomic History   Marital status: Unknown    Spouse name: Not on file   Number of children: 2   Years of education: Not on file   Highest education level: Not on file  Occupational History   Occupation: retired  Tobacco Use   Smoking status: Former    Pack years: 0.00    Types: Cigarettes    Quit date: 06/20/1948    Years since quitting: 72.4   Smokeless tobacco: Never   Tobacco comments:    smoked in high school  Vaping Use  Vaping Use: Never used  Substance and Sexual Activity   Alcohol use: No   Drug use: No   Sexual activity: Not on file  Other Topics Concern   Not on file  Social History Narrative   Not on file   Social Determinants of Health   Financial Resource Strain: Not on file  Food Insecurity: Not on file  Transportation Needs: Not on file  Physical Activity: Not on file  Stress: Not on file  Social Connections: Not on file     Family History: The patient's family history includes Brain cancer in his mother; CAD in his father; Heart attack in his brother; Stroke in  his brother, brother, and brother. ROS:   Please see the history of present illness.    All other systems reviewed and are negative.  EKGs/Labs/Other Studies Reviewed:    The following studies were reviewed today:  EKG:  EKG ordered today and personally reviewed.  The ekg ordered today demonstrates sinus 61 bpm left bundle branch block  Recent Labs: No results found for requested labs within last 8760 hours.  Recent Lipid Panel No results found for: CHOL, TRIG, HDL, CHOLHDL, VLDL, LDLCALC, LDLDIRECT  Physical Exam:    VS:  BP 120/70 (BP Location: Right Arm, Patient Position: Sitting, Cuff Size: Normal)   Pulse 61   Ht 5\' 11"  (1.803 m)   Wt 202 lb (91.6 kg)   SpO2 95%   BMI 28.17 kg/m     Wt Readings from Last 3 Encounters:  11/26/20 202 lb (91.6 kg)  12/07/18 196 lb (88.9 kg)     GEN:  Well nourished, well developed in no acute distress HEENT: Normal NECK: No JVD; No carotid bruits LYMPHATICS: No lymphadenopathy CARDIAC: Second heart sound is paradoxical RRR, no murmurs, rubs, gallops RESPIRATORY:  Clear to auscultation without rales, wheezing or rhonchi  ABDOMEN: Soft, non-tender, non-distended MUSCULOSKELETAL:  No edema; No deformity  SKIN: Warm and dry NEUROLOGIC:  Alert and oriented x 3 PSYCHIATRIC:  Normal affect    Signed, 12/09/18, MD  11/26/2020 1:48 PM    [facility]

## 2020-11-26 ENCOUNTER — Other Ambulatory Visit: Payer: Self-pay

## 2020-11-26 ENCOUNTER — Encounter: Payer: Self-pay | Admitting: Cardiology

## 2020-11-26 ENCOUNTER — Ambulatory Visit: Payer: Medicare Other | Admitting: Cardiology

## 2020-11-26 VITALS — BP 120/70 | HR 61 | Ht 71.0 in | Wt 202.0 lb

## 2020-11-26 DIAGNOSIS — E782 Mixed hyperlipidemia: Secondary | ICD-10-CM

## 2020-11-26 DIAGNOSIS — I447 Left bundle-branch block, unspecified: Secondary | ICD-10-CM

## 2020-11-26 DIAGNOSIS — I10 Essential (primary) hypertension: Secondary | ICD-10-CM | POA: Diagnosis not present

## 2020-11-26 DIAGNOSIS — N529 Male erectile dysfunction, unspecified: Secondary | ICD-10-CM

## 2020-11-26 NOTE — Patient Instructions (Signed)

## 2021-01-19 DIAGNOSIS — N529 Male erectile dysfunction, unspecified: Secondary | ICD-10-CM | POA: Diagnosis not present

## 2021-01-19 DIAGNOSIS — G7000 Myasthenia gravis without (acute) exacerbation: Secondary | ICD-10-CM | POA: Diagnosis not present

## 2021-01-19 DIAGNOSIS — E663 Overweight: Secondary | ICD-10-CM | POA: Diagnosis not present

## 2021-01-19 DIAGNOSIS — I10 Essential (primary) hypertension: Secondary | ICD-10-CM | POA: Diagnosis not present

## 2021-03-31 DIAGNOSIS — R059 Cough, unspecified: Secondary | ICD-10-CM | POA: Diagnosis not present

## 2021-03-31 DIAGNOSIS — Z20828 Contact with and (suspected) exposure to other viral communicable diseases: Secondary | ICD-10-CM | POA: Diagnosis not present

## 2021-05-10 DIAGNOSIS — R509 Fever, unspecified: Secondary | ICD-10-CM | POA: Diagnosis not present

## 2021-05-10 DIAGNOSIS — R0602 Shortness of breath: Secondary | ICD-10-CM | POA: Diagnosis not present

## 2021-05-10 DIAGNOSIS — J101 Influenza due to other identified influenza virus with other respiratory manifestations: Secondary | ICD-10-CM | POA: Diagnosis not present

## 2021-05-10 DIAGNOSIS — J189 Pneumonia, unspecified organism: Secondary | ICD-10-CM | POA: Diagnosis not present

## 2021-05-10 DIAGNOSIS — R059 Cough, unspecified: Secondary | ICD-10-CM | POA: Diagnosis not present

## 2021-05-10 DIAGNOSIS — R5381 Other malaise: Secondary | ICD-10-CM | POA: Diagnosis not present

## 2021-05-11 DIAGNOSIS — R509 Fever, unspecified: Secondary | ICD-10-CM | POA: Diagnosis not present

## 2021-05-11 DIAGNOSIS — G7000 Myasthenia gravis without (acute) exacerbation: Secondary | ICD-10-CM | POA: Diagnosis not present

## 2021-05-11 DIAGNOSIS — R5381 Other malaise: Secondary | ICD-10-CM | POA: Diagnosis not present

## 2021-05-11 DIAGNOSIS — J189 Pneumonia, unspecified organism: Secondary | ICD-10-CM | POA: Diagnosis not present

## 2021-05-11 DIAGNOSIS — J101 Influenza due to other identified influenza virus with other respiratory manifestations: Secondary | ICD-10-CM | POA: Diagnosis not present

## 2021-05-11 DIAGNOSIS — R0602 Shortness of breath: Secondary | ICD-10-CM | POA: Diagnosis not present

## 2021-05-11 DIAGNOSIS — Z7982 Long term (current) use of aspirin: Secondary | ICD-10-CM | POA: Diagnosis not present

## 2021-05-11 DIAGNOSIS — Z792 Long term (current) use of antibiotics: Secondary | ICD-10-CM | POA: Diagnosis not present

## 2021-05-11 DIAGNOSIS — Z23 Encounter for immunization: Secondary | ICD-10-CM | POA: Diagnosis not present

## 2021-05-11 DIAGNOSIS — R059 Cough, unspecified: Secondary | ICD-10-CM | POA: Diagnosis not present

## 2021-05-11 DIAGNOSIS — J1000 Influenza due to other identified influenza virus with unspecified type of pneumonia: Secondary | ICD-10-CM | POA: Diagnosis not present

## 2021-05-11 DIAGNOSIS — Z20822 Contact with and (suspected) exposure to covid-19: Secondary | ICD-10-CM | POA: Diagnosis not present

## 2021-05-11 DIAGNOSIS — Z79899 Other long term (current) drug therapy: Secondary | ICD-10-CM | POA: Diagnosis not present

## 2021-05-20 DIAGNOSIS — H903 Sensorineural hearing loss, bilateral: Secondary | ICD-10-CM | POA: Diagnosis not present

## 2021-09-02 DIAGNOSIS — Z6829 Body mass index (BMI) 29.0-29.9, adult: Secondary | ICD-10-CM | POA: Diagnosis not present

## 2021-09-02 DIAGNOSIS — Z2821 Immunization not carried out because of patient refusal: Secondary | ICD-10-CM | POA: Diagnosis not present

## 2021-09-02 DIAGNOSIS — I10 Essential (primary) hypertension: Secondary | ICD-10-CM | POA: Diagnosis not present

## 2021-09-02 DIAGNOSIS — E782 Mixed hyperlipidemia: Secondary | ICD-10-CM | POA: Diagnosis not present

## 2022-02-17 DIAGNOSIS — Z6829 Body mass index (BMI) 29.0-29.9, adult: Secondary | ICD-10-CM | POA: Diagnosis not present

## 2022-02-17 DIAGNOSIS — Z1331 Encounter for screening for depression: Secondary | ICD-10-CM | POA: Diagnosis not present

## 2022-02-17 DIAGNOSIS — Z0000 Encounter for general adult medical examination without abnormal findings: Secondary | ICD-10-CM | POA: Diagnosis not present

## 2022-06-21 DIAGNOSIS — Z6828 Body mass index (BMI) 28.0-28.9, adult: Secondary | ICD-10-CM | POA: Diagnosis not present

## 2022-06-21 DIAGNOSIS — H9190 Unspecified hearing loss, unspecified ear: Secondary | ICD-10-CM | POA: Diagnosis not present

## 2022-08-15 DIAGNOSIS — K080 Exfoliation of teeth due to systemic causes: Secondary | ICD-10-CM | POA: Diagnosis not present

## 2022-10-25 DIAGNOSIS — J069 Acute upper respiratory infection, unspecified: Secondary | ICD-10-CM | POA: Diagnosis not present

## 2022-10-25 DIAGNOSIS — Z6828 Body mass index (BMI) 28.0-28.9, adult: Secondary | ICD-10-CM | POA: Diagnosis not present

## 2022-10-25 DIAGNOSIS — L989 Disorder of the skin and subcutaneous tissue, unspecified: Secondary | ICD-10-CM | POA: Diagnosis not present

## 2022-11-28 DIAGNOSIS — E663 Overweight: Secondary | ICD-10-CM | POA: Diagnosis not present

## 2022-11-28 DIAGNOSIS — G7000 Myasthenia gravis without (acute) exacerbation: Secondary | ICD-10-CM | POA: Diagnosis not present

## 2022-11-28 DIAGNOSIS — Z79899 Other long term (current) drug therapy: Secondary | ICD-10-CM | POA: Diagnosis not present

## 2022-11-28 DIAGNOSIS — E782 Mixed hyperlipidemia: Secondary | ICD-10-CM | POA: Diagnosis not present

## 2022-11-28 DIAGNOSIS — K429 Umbilical hernia without obstruction or gangrene: Secondary | ICD-10-CM | POA: Diagnosis not present

## 2022-12-06 DIAGNOSIS — K429 Umbilical hernia without obstruction or gangrene: Secondary | ICD-10-CM | POA: Diagnosis not present

## 2022-12-15 DIAGNOSIS — L570 Actinic keratosis: Secondary | ICD-10-CM | POA: Diagnosis not present

## 2022-12-15 DIAGNOSIS — L820 Inflamed seborrheic keratosis: Secondary | ICD-10-CM | POA: Diagnosis not present

## 2022-12-25 DIAGNOSIS — J069 Acute upper respiratory infection, unspecified: Secondary | ICD-10-CM | POA: Diagnosis not present

## 2023-01-03 DIAGNOSIS — J22 Unspecified acute lower respiratory infection: Secondary | ICD-10-CM | POA: Diagnosis not present

## 2023-01-03 DIAGNOSIS — R9389 Abnormal findings on diagnostic imaging of other specified body structures: Secondary | ICD-10-CM | POA: Diagnosis not present

## 2023-01-03 DIAGNOSIS — Z6828 Body mass index (BMI) 28.0-28.9, adult: Secondary | ICD-10-CM | POA: Diagnosis not present

## 2023-01-19 DIAGNOSIS — G7000 Myasthenia gravis without (acute) exacerbation: Secondary | ICD-10-CM | POA: Diagnosis not present

## 2023-01-20 DIAGNOSIS — G7000 Myasthenia gravis without (acute) exacerbation: Secondary | ICD-10-CM | POA: Diagnosis not present

## 2023-01-20 DIAGNOSIS — I10 Essential (primary) hypertension: Secondary | ICD-10-CM | POA: Diagnosis not present

## 2023-01-20 DIAGNOSIS — E782 Mixed hyperlipidemia: Secondary | ICD-10-CM | POA: Diagnosis not present

## 2023-01-20 DIAGNOSIS — I447 Left bundle-branch block, unspecified: Secondary | ICD-10-CM | POA: Diagnosis not present

## 2023-01-26 DIAGNOSIS — I10 Essential (primary) hypertension: Secondary | ICD-10-CM | POA: Diagnosis not present

## 2023-01-26 DIAGNOSIS — L720 Epidermal cyst: Secondary | ICD-10-CM | POA: Diagnosis not present

## 2023-01-26 DIAGNOSIS — G7000 Myasthenia gravis without (acute) exacerbation: Secondary | ICD-10-CM | POA: Diagnosis not present

## 2023-01-26 DIAGNOSIS — K5730 Diverticulosis of large intestine without perforation or abscess without bleeding: Secondary | ICD-10-CM | POA: Diagnosis not present

## 2023-01-26 DIAGNOSIS — K429 Umbilical hernia without obstruction or gangrene: Secondary | ICD-10-CM | POA: Diagnosis not present

## 2023-01-26 DIAGNOSIS — L723 Sebaceous cyst: Secondary | ICD-10-CM | POA: Diagnosis not present

## 2023-02-07 DIAGNOSIS — I10 Essential (primary) hypertension: Secondary | ICD-10-CM | POA: Diagnosis not present

## 2023-02-08 DIAGNOSIS — G7000 Myasthenia gravis without (acute) exacerbation: Secondary | ICD-10-CM | POA: Diagnosis not present

## 2023-02-08 NOTE — Progress Notes (Unsigned)
Cardiology Office Note:    Date:  02/09/2023   ID:  Chad, Griffith September 15, 1940, MRN [id number]  PCP:  Chad Handler, NP  Cardiologist:  Chad Herrlich, MD    Referring MD: Chad Handler, NP    ASSESSMENT:    1. LBBB (left bundle branch block)   2. Mixed hyperlipidemia   3. Primary hypertension    PLAN:    In order of problems listed above:  Stable pattern on repeat EKG he is asymptomatic and has no progressive conduction system disease Continue his statin his lipids are at target Continue his current antihypertensive ARB I would not push his diastolic blood pressure any lower After discussion and undergo a vascular screening in our office continue aspirin   Next appointment: 1 year   Medication Adjustments/Labs and Tests Ordered: Current medicines are reviewed at length with the patient today.  Concerns regarding medicines are outlined above.  Orders Placed This Encounter  Procedures   EKG 12-Lead   No orders of the defined types were placed in this encounter.    History of Present Illness:    Chad Griffith is a 82 y.o. male with a hx of myasthenia gravis left bundle branch block elevated blood pressure without a diagnosis of hypertension and hyperlipidemia last seen 11/26/2020. Compliance with diet, lifestyle and medications: Yes  Subjectively he feels well no cardiovascular symptoms of edema shortness of breath chest pain palpitation or syncope His wife is concerned with a history of heart and vascular disease and stroke Pressure at home runs in the range of 140 60 I repeated blood pressure myself 130/60 in the right arm And tolerates his statin without muscle pain or weakness Lipid profile cholesterol 140 triglycerides 111 HDL 43 non-HDL cholesterol 97 Past Medical History:  Diagnosis Date   LBBB (left bundle branch block) 12/04/2018    Current Medications: Current Meds  Medication Sig   ascorbic acid (VITAMIN C) 500 MG tablet Take 500 mg by mouth  daily.   aspirin 81 MG tablet Take 81 mg by mouth daily.     folic acid (FOLVITE) 800 MCG tablet Take 400 mcg by mouth daily.   olmesartan (BENICAR) 40 MG tablet Take 40 mg by mouth daily.   pravastatin (PRAVACHOL) 40 MG tablet Take 40 mg by mouth daily.   vitamin B-12 (CYANOCOBALAMIN) 1000 MCG tablet Take 1,000 mcg by mouth daily.    zinc gluconate 50 MG tablet Take 50 mg by mouth daily.      EKGs/Labs/Other Studies Reviewed:    The following studies were reviewed today:  Cardiac Studies & Procedures       ECHOCARDIOGRAM  ECHOCARDIOGRAM COMPLETE 01/18/2019  Narrative ECHOCARDIOGRAM REPORT    Patient Name:   Chad Griffith Date of Exam: 01/18/2019 Medical Rec #:  [id number]      Height:       71.0 in Accession #:    [id number]     Weight:       196.0 lb Date of Birth:  December 29, 1940      BSA:          2.09 m Patient Age:    78 years       BP:           152/82 mmHg Patient Gender: M              HR:           58 bpm. Exam Location:  [facility]   Procedure: 2D Echo  Indications:    LBBB (left bundle branch block) [409811]  History:        Patient has no prior history of Echocardiogram examinations. Risk Factors: Dyslipidemia.  Sonographer:    Chad Griffith Referring Phys: [phone] Chad Griffith  IMPRESSIONS   1. The left ventricle has normal systolic function, with an ejection fraction of 55-60%. The cavity size was normal. Left ventricular diastolic Doppler parameters are consistent with impaired relaxation. No evidence of left ventricular regional wall motion abnormalities. 2. The right ventricle has normal systolic function. The cavity was normal. There is no increase in right ventricular wall thickness. 3. No evidence of mitral valve stenosis. 4. The aortic valve is tricuspid. No stenosis of the aortic valve. 5. The aorta is normal in size and structure. 6. The aortic root and ascending aorta are normal in size and structure.  FINDINGS Left Ventricle: The left ventricle  has normal systolic function, with an ejection fraction of 55-60%. The cavity size was normal. There is borderline increase in left ventricular wall thickness. Left ventricular diastolic Doppler parameters are consistent with impaired relaxation. Normal left ventricular filling pressures No evidence of left ventricular regional wall motion abnormalities..  Right Ventricle: The right ventricle has normal systolic function. The cavity was normal. There is no increase in right ventricular wall thickness.  Left Atrium: Left atrial size was normal in size.  Right Atrium: Right atrial size was normal in size. Right atrial pressure is estimated at 3 mmHg.  Interatrial Septum: No atrial level shunt detected by color flow Doppler.  Pericardium: There is no evidence of pericardial effusion.  Mitral Valve: The mitral valve is normal in structure. Mitral valve regurgitation is not visualized by color flow Doppler. No evidence of mitral valve stenosis. Pulmonary venous flow is normal.  Tricuspid Valve: The tricuspid valve is normal in structure. Tricuspid valve regurgitation was not visualized by color flow Doppler.  Aortic Valve: The aortic valve is tricuspid Aortic valve regurgitation was not visualized by color flow Doppler. There is No stenosis of the aortic valve.  Pulmonic Valve: The pulmonic valve was normal in structure. Pulmonic valve regurgitation is trivial by color flow Doppler. No evidence of pulmonic stenosis.  Aorta: The aortic root and ascending aorta are normal in size and structure. The aorta is normal in size and structure.  Venous: The inferior vena cava measures 1.40 cm, is normal in size with greater than 50% respiratory variability.   +--------------+-------++ LEFT VENTRICLE              +--------------+---------++ +--------------+-------++       Diastology              PLAX 2D                     +--------------+---------++ +--------------+-------++       LV e'  lateral:5.98 cm/s LVIDd:        5.00 cm       +--------------+---------++ +--------------+-------++       LV e' medial: 4.46 cm/s LVIDs:        3.10 cm       +--------------+---------++ +--------------+-------++ LV PW:        1.10 cm +--------------+-------++ LV IVS:       1.10 cm +--------------+-------++ LV SV:        80 ml   +--------------+-------++ LV SV Index:  37.80   +--------------+-------++                       +--------------+-------++  +------------------+---------++  LV Volumes (MOD)            +------------------+---------++ LV area d, A2C:   31.30 cm +------------------+---------++ LV area d, A4C:   31.70 cm +------------------+---------++ LV area s, A2C:   18.70 cm +------------------+---------++ LV area s, A4C:   18.60 cm +------------------+---------++ LV major d, A2C:  9.02 cm   +------------------+---------++ LV major d, A4C:  8.46 cm   +------------------+---------++ LV major s, A2C:  7.25 cm   +------------------+---------++ LV major s, A4C:  7.19 cm   +------------------+---------++ LV vol d, MOD A2C:90.1 ml   +------------------+---------++ LV vol d, MOD A4C:97.7 ml   +------------------+---------++ LV vol s, MOD A2C:42.3 ml   +------------------+---------++ LV vol s, MOD A4C:39.8 ml   +------------------+---------++ LV SV MOD A2C:    47.8 ml   +------------------+---------++ LV SV MOD A4C:    97.7 ml   +------------------+---------++ LV SV MOD BP:     56.0 ml   +------------------+---------++  +---------------+----------++ RIGHT VENTRICLE           +---------------+----------++ RV S prime:    11.30 cm/s +---------------+----------++ TAPSE (M-mode):2.3 cm     +---------------+----------++  +-------------+-------++-----------++ LEFT ATRIUM         Index       +-------------+-------++-----------++ LA diam:      3.90 cm1.87 cm/m  +-------------+-------++-----------++ LA Vol (A2C):83.0 ml39.70 ml/m +-------------+-------++-----------++ LA Vol (A4C):65.7 ml31.43 ml/m +-------------+-------++-----------++ +------------+---------++-----------++ RIGHT ATRIUM         Index       +------------+---------++-----------++ RA Area:    16.60 cm            +------------+---------++-----------++ RA Volume:  46.00 ml 22.00 ml/m +------------+---------++-----------++ +------------+-----------++ AORTIC VALVE            +------------+-----------++ LVOT Vmax:  122.00 cm/s +------------+-----------++ LVOT Vmean: 71.800 cm/s +------------+-----------++ LVOT VTI:   0.223 m     +------------+-----------++  +-------------+-------++ AORTA                +-------------+-------++ Ao Root diam:3.60 cm +-------------+-------++ Ao Asc diam: 3.60 cm +-------------+-------++  +---------------+-----------++ TRICUSPID VALVE            +---------------+-----------++ TR Peak grad:  14.4 mmHg   +---------------+-----------++ TR Vmax:       205.00 cm/s +---------------+-----------++  +-------------+------+ SHUNTS              +-------------+------+ Systemic VTI:0.22 m +-------------+------+  +---------+-------+ IVC              +---------+-------+ IVC diam:1.40 cm +---------+-------+   Chad Herrlich MD Electronically signed by Chad Herrlich MD Signature Date/Time: 01/18/2019/12:43:15 PM    Final             EKG Interpretation Date/Time:  Thursday February 09 2023 13:19:19 EDT Ventricular Rate:  59 PR Interval:  230 QRS Duration:  140 QT Interval:  416 QTC Calculation: 411 R Axis:   52  Text Interpretation: Sinus bradycardia with 1st degree A-V block Left bundle branch block No previous ECGs available Confirmed by Chad Griffith ([id number]) on 02/09/2023 1:24:51 PM    Physical Exam:    VS:  BP 124/70 (BP  Location: Right Arm, Patient Position: Sitting, Cuff Size: Normal)   Pulse (!) 59   Ht 5\' 11"  (1.803 m)   Wt 196 lb (88.9 kg)   SpO2 95%   BMI 27.34 kg/m     Wt Readings from Last 3 Encounters:  02/09/23 196 lb (88.9 kg)  11/26/20 202 lb (91.6 kg)  12/07/18 196 lb (  88.9 kg)     GEN:  Well nourished, well developed in no acute distress HEENT: Normal NECK: No JVD; No carotid bruits LYMPHATICS: No lymphadenopathy CARDIAC: RRR, no murmurs, rubs, gallops RESPIRATORY:  Clear to auscultation without rales, wheezing or rhonchi  ABDOMEN: Soft, non-tender, non-distended MUSCULOSKELETAL:  No edema; No deformity  SKIN: Warm and dry NEUROLOGIC:  Alert and oriented x 3 PSYCHIATRIC:  Normal affect    Signed, Chad Herrlich, MD  02/09/2023 1:41 PM    [facility]

## 2023-02-09 ENCOUNTER — Encounter: Payer: Self-pay | Admitting: Cardiology

## 2023-02-09 ENCOUNTER — Ambulatory Visit: Payer: Medicare Other | Attending: Cardiology | Admitting: Cardiology

## 2023-02-09 VITALS — BP 130/60 | HR 59 | Ht 71.0 in | Wt 196.0 lb

## 2023-02-09 DIAGNOSIS — I447 Left bundle-branch block, unspecified: Secondary | ICD-10-CM

## 2023-02-09 DIAGNOSIS — I10 Essential (primary) hypertension: Secondary | ICD-10-CM

## 2023-02-09 DIAGNOSIS — E782 Mixed hyperlipidemia: Secondary | ICD-10-CM

## 2023-02-09 NOTE — Patient Instructions (Signed)
 Medication Instructions:  Your physician recommends that you continue on your current medications as directed. Please refer to the Current Medication list given to you today.  *If you need a refill on your cardiac medications before your next appointment, please call your pharmacy*   Lab Work: None If you have labs (blood work) drawn today and your tests are completely normal, you will receive your results only by: MyChart Message (if you have MyChart) OR A paper copy in the mail If you have any lab test that is abnormal or we need to change your treatment, we will call you to review the results.   Testing/Procedures: Vascuscreen   Follow-Up: At [facility], you and your health needs are our priority.  As part of our continuing mission to provide you with exceptional heart care, we have created designated Provider Care Teams.  These Care Teams include your primary Cardiologist (physician) and Advanced Practice Providers (APPs -  Physician Assistants and Nurse Practitioners) who all work together to provide you with the care you need, when you need it.  We recommend signing up for the patient portal called "MyChart".  Sign up information is provided on this After Visit Summary.  MyChart is used to connect with patients for Virtual Visits (Telemedicine).  Patients are able to view lab/test results, encounter notes, upcoming appointments, etc.  Non-urgent messages can be sent to your provider as well.   To learn more about what you can do with MyChart, go to [email or web address].    Your next appointment:   1 year(s)  Provider:   Norman Herrlich, MD    Other Instructions None

## 2023-02-13 DIAGNOSIS — K080 Exfoliation of teeth due to systemic causes: Secondary | ICD-10-CM | POA: Diagnosis not present

## 2023-03-15 ENCOUNTER — Ambulatory Visit: Payer: Medicare Other

## 2023-03-21 ENCOUNTER — Ambulatory Visit: Payer: Medicare Other

## 2023-03-27 ENCOUNTER — Ambulatory Visit: Payer: Medicare Other | Admitting: Neurology

## 2023-04-06 DIAGNOSIS — Z23 Encounter for immunization: Secondary | ICD-10-CM | POA: Diagnosis not present

## 2023-04-06 DIAGNOSIS — N400 Enlarged prostate without lower urinary tract symptoms: Secondary | ICD-10-CM | POA: Diagnosis not present

## 2023-04-06 DIAGNOSIS — Z131 Encounter for screening for diabetes mellitus: Secondary | ICD-10-CM | POA: Diagnosis not present

## 2023-04-06 DIAGNOSIS — Z125 Encounter for screening for malignant neoplasm of prostate: Secondary | ICD-10-CM | POA: Diagnosis not present

## 2023-04-06 DIAGNOSIS — Z0000 Encounter for general adult medical examination without abnormal findings: Secondary | ICD-10-CM | POA: Diagnosis not present

## 2023-04-06 DIAGNOSIS — E782 Mixed hyperlipidemia: Secondary | ICD-10-CM | POA: Diagnosis not present

## 2023-04-06 DIAGNOSIS — I10 Essential (primary) hypertension: Secondary | ICD-10-CM | POA: Diagnosis not present

## 2023-04-13 ENCOUNTER — Ambulatory Visit: Payer: Medicare Other | Attending: Cardiology

## 2023-04-13 DIAGNOSIS — E782 Mixed hyperlipidemia: Secondary | ICD-10-CM

## 2023-04-13 DIAGNOSIS — I10 Essential (primary) hypertension: Secondary | ICD-10-CM

## 2023-04-13 DIAGNOSIS — I447 Left bundle-branch block, unspecified: Secondary | ICD-10-CM

## 2023-04-14 ENCOUNTER — Encounter: Payer: Self-pay | Admitting: Cardiology

## 2023-04-18 ENCOUNTER — Encounter (HOSPITAL_COMMUNITY): Payer: Self-pay | Admitting: Cardiology

## 2023-05-10 DIAGNOSIS — D485 Neoplasm of uncertain behavior of skin: Secondary | ICD-10-CM | POA: Diagnosis not present

## 2023-05-10 DIAGNOSIS — C4441 Basal cell carcinoma of skin of scalp and neck: Secondary | ICD-10-CM | POA: Diagnosis not present

## 2023-05-10 DIAGNOSIS — L820 Inflamed seborrheic keratosis: Secondary | ICD-10-CM | POA: Diagnosis not present

## 2023-05-10 DIAGNOSIS — L720 Epidermal cyst: Secondary | ICD-10-CM | POA: Diagnosis not present

## 2023-05-10 DIAGNOSIS — L570 Actinic keratosis: Secondary | ICD-10-CM | POA: Diagnosis not present

## 2023-05-11 DIAGNOSIS — C434 Malignant melanoma of scalp and neck: Secondary | ICD-10-CM | POA: Diagnosis not present

## 2023-05-24 DIAGNOSIS — C434 Malignant melanoma of scalp and neck: Secondary | ICD-10-CM | POA: Diagnosis not present

## 2023-06-10 DIAGNOSIS — L02811 Cutaneous abscess of head [any part, except face]: Secondary | ICD-10-CM | POA: Diagnosis not present

## 2023-06-27 DIAGNOSIS — N401 Enlarged prostate with lower urinary tract symptoms: Secondary | ICD-10-CM | POA: Diagnosis not present

## 2023-06-27 DIAGNOSIS — R972 Elevated prostate specific antigen [PSA]: Secondary | ICD-10-CM | POA: Diagnosis not present

## 2023-08-11 DIAGNOSIS — G7000 Myasthenia gravis without (acute) exacerbation: Secondary | ICD-10-CM | POA: Diagnosis not present

## 2023-08-17 DIAGNOSIS — N401 Enlarged prostate with lower urinary tract symptoms: Secondary | ICD-10-CM | POA: Diagnosis not present

## 2023-08-17 DIAGNOSIS — K080 Exfoliation of teeth due to systemic causes: Secondary | ICD-10-CM | POA: Diagnosis not present

## 2023-08-17 DIAGNOSIS — R972 Elevated prostate specific antigen [PSA]: Secondary | ICD-10-CM | POA: Diagnosis not present

## 2023-08-28 DIAGNOSIS — R972 Elevated prostate specific antigen [PSA]: Secondary | ICD-10-CM | POA: Diagnosis not present

## 2023-08-28 DIAGNOSIS — N401 Enlarged prostate with lower urinary tract symptoms: Secondary | ICD-10-CM | POA: Diagnosis not present

## 2023-09-04 DIAGNOSIS — R972 Elevated prostate specific antigen [PSA]: Secondary | ICD-10-CM | POA: Diagnosis not present

## 2023-09-04 DIAGNOSIS — N401 Enlarged prostate with lower urinary tract symptoms: Secondary | ICD-10-CM | POA: Diagnosis not present

## 2023-10-10 DIAGNOSIS — D485 Neoplasm of uncertain behavior of skin: Secondary | ICD-10-CM | POA: Diagnosis not present

## 2023-10-10 DIAGNOSIS — L821 Other seborrheic keratosis: Secondary | ICD-10-CM | POA: Diagnosis not present

## 2023-10-10 DIAGNOSIS — Z85820 Personal history of malignant melanoma of skin: Secondary | ICD-10-CM | POA: Diagnosis not present

## 2023-10-10 DIAGNOSIS — D2239 Melanocytic nevi of other parts of face: Secondary | ICD-10-CM | POA: Diagnosis not present

## 2023-10-10 DIAGNOSIS — D225 Melanocytic nevi of trunk: Secondary | ICD-10-CM | POA: Diagnosis not present

## 2023-10-18 DIAGNOSIS — C44519 Basal cell carcinoma of skin of other part of trunk: Secondary | ICD-10-CM | POA: Diagnosis not present

## 2023-12-09 DIAGNOSIS — G8929 Other chronic pain: Secondary | ICD-10-CM | POA: Diagnosis not present

## 2024-02-08 NOTE — Progress Notes (Signed)
 Cardiology Office Note:    Date:  02/13/2024   ID:  Chad Griffith, Faciane 03-11-1941, MRN [id number]  PCP:  Benjamine Lauraine DASEN, NP  Cardiologist:  Redell Leiter, MD    Referring MD: Benjamine Lauraine DASEN, NP    ASSESSMENT:    1. LBBB (left bundle branch block)   2. Mixed hyperlipidemia   3. Primary hypertension   4. Atherosclerosis of both carotid arteries    PLAN:    In order of problems listed above:  Chad Griffith is doing well from a cardiology perspective left bundle branch block without cardiomyopathy stable EKG conduction pattern hypertension well-controlled and hyperlipidemia lipids are at target with a low intensity statin well-tolerated with his myasthenia gravis Mild carotid atherosclerosis does not require repeat duplex this year Continue aspirin and his current antihypertensive olmesartan   Next appointment: I will plan to see him in 1 year   Medication Adjustments/Labs and Tests Ordered: Current medicines are reviewed at length with the patient today.  Concerns regarding medicines are outlined above.  Orders Placed This Encounter  Procedures   EKG 12-Lead   No orders of the defined types were placed in this encounter.    History of Present Illness:    DOYT Chad Griffith is a 83 y.o. male with a hx of myasthenia gravis left bundle branch block elevated blood pressure without diagnosis of hypertension and hyperlipidemia last seen 02/09/2023.  After his last visit he had a vascular screen showed mild bilateral proximal ICA stenosis less than 39% ABI normal bilaterally and no evidence of aortic aneurysm.  Echocardiogram 2020 showed normal left ventricular size wall thickness and EF 55 to 60%.  Compliance with diet, lifestyle and medications: Yes  Chad Griffith is seen in follow-up in the office doing better at myasthenia no longer requires continuous therapy From a cardiology perspective feeling well and is not having symptoms of cardiomyopathy edema shortness of breath chest pain palpitation  or syncope He tolerates lipid-lowering therapy with a low intensity statin Past Medical History:  Diagnosis Date   B12 deficiency 03/29/2014   Bladder neck obstruction 05/10/2013   Diverticulosis of colon 05/10/2013   Encounter for monitoring of methotrexate therapy 03/24/2014   Enlarged prostate with lower urinary tract symptoms (LUTS) 05/10/2013   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   aspirin 81 MG tablet Take 81 mg by mouth daily.     folic acid (FOLVITE) 800 MCG tablet Take 400 mcg by mouth daily.   olmesartan (BENICAR) 40 MG tablet Take 40 mg by mouth daily.   pravastatin (PRAVACHOL) 40 MG tablet Take 40 mg by mouth daily.   vitamin B-12 (CYANOCOBALAMIN) 1000 MCG tablet Take 1,000 mcg by mouth daily.    zinc gluconate 50 MG tablet Take 50 mg by mouth daily.      EKGs/Labs/Other Studies Reviewed:    The following studies were reviewed today:  Cardiac Studies & Procedures   ______________________________________________________________________________________________     ECHOCARDIOGRAM  ECHOCARDIOGRAM COMPLETE 01/18/2019  Narrative ECHOCARDIOGRAM REPORT    Patient Name:  Chad Griffith Date of Exam: 01/18/2019 Medical Rec #:  [id number]      Height:       71.0 in Accession #:    [id number]     Weight:       196.0 lb Date of Birth:  06/16/1941      BSA:          2.09 m Patient Age:    78 years       BP:           152/82 mmHg Patient Gender: M              HR:           58 bpm. Exam  Location:  [place]   Procedure: 2D Echo  Indications:    LBBB (left bundle branch block) [[id number]]  History:        Patient has no prior history of Echocardiogram examinations. Risk Factors: Dyslipidemia.  Sonographer:    Lynwood Silvas Referring Phys: [phone] Dewarren Ledbetter J Dynasti Kerman  IMPRESSIONS   1. The left ventricle has normal systolic function, with an ejection fraction of 55-60%. The cavity size was normal. Left ventricular diastolic Doppler parameters are consistent with impaired relaxation. No evidence of left ventricular regional wall motion abnormalities. 2. The right ventricle has normal systolic function. The cavity was normal. There is no increase in right ventricular wall thickness. 3. No evidence of mitral valve stenosis. 4. The aortic valve is tricuspid. No stenosis of the aortic valve. 5. The aorta is normal in size and structure. 6. The aortic root and ascending aorta are normal in size and structure.  FINDINGS Left Ventricle: The left ventricle has normal systolic function, with an ejection fraction of 55-60%. The cavity size was normal. There is borderline increase in left ventricular wall thickness. Left ventricular diastolic Doppler parameters are consistent with impaired relaxation. Normal left ventricular filling pressures No evidence of left ventricular regional wall motion abnormalities..  Right Ventricle: The right ventricle has normal systolic function. The cavity was normal. There is no increase in right ventricular wall thickness.  Left Atrium: Left atrial size was normal in size.  Right Atrium: Right atrial size was normal in size. Right atrial pressure is estimated at 3 mmHg.  Interatrial Septum: No atrial level shunt detected by color flow Doppler.  Pericardium: There is no evidence of pericardial effusion.  Mitral Valve: The mitral valve is normal in structure. Mitral valve regurgitation is not visualized by color flow Doppler. No evidence of mitral valve  stenosis. Pulmonary venous flow is normal.  Tricuspid Valve: The tricuspid valve is normal in structure. Tricuspid valve regurgitation was not visualized by color flow Doppler.  Aortic Valve: The aortic valve is tricuspid Aortic valve regurgitation was not visualized by color flow Doppler. There is No stenosis of the aortic valve.  Pulmonic Valve: The pulmonic valve was normal in structure. Pulmonic valve regurgitation is trivial by color flow Doppler. No evidence of pulmonic stenosis.  Aorta: The aortic root and ascending aorta are normal in size and structure. The aorta is normal in size and structure.  Venous: The inferior vena cava measures 1.40 cm, is normal in size with greater than 50% respiratory variability.   +--------------+-------++ LEFT VENTRICLE              +--------------+---------++ +--------------+-------++       Diastology              PLAX 2D                     +--------------+---------++ +--------------+-------++  LV e' lateral:5.98 cm/s LVIDd:        5.00 cm       +--------------+---------++ +--------------+-------++       LV e' medial: 4.46 cm/s LVIDs:        3.10 cm       +--------------+---------++ +--------------+-------++ LV PW:        1.10 cm +--------------+-------++ LV IVS:       1.10 cm +--------------+-------++ LV SV:        80 ml   +--------------+-------++ LV SV Index:  37.80   +--------------+-------++                       +--------------+-------++  +------------------+---------++ LV Volumes (MOD)            +------------------+---------++ LV area d, A2C:   31.30 cm +------------------+---------++ LV area d, A4C:   31.70 cm +------------------+---------++ LV area s, A2C:   18.70 cm +------------------+---------++ LV area s, A4C:   18.60 cm +------------------+---------++ LV major d, A2C:  9.02 cm   +------------------+---------++ LV major d, A4C:  8.46  cm   +------------------+---------++ LV major s, A2C:  7.25 cm   +------------------+---------++ LV major s, A4C:  7.19 cm   +------------------+---------++ LV vol d, MOD A2C:90.1 ml   +------------------+---------++ LV vol d, MOD A4C:97.7 ml   +------------------+---------++ LV vol s, MOD A2C:42.3 ml   +------------------+---------++ LV vol s, MOD A4C:39.8 ml   +------------------+---------++ LV SV MOD A2C:    47.8 ml   +------------------+---------++ LV SV MOD A4C:    97.7 ml   +------------------+---------++ LV SV MOD BP:     56.0 ml   +------------------+---------++  +---------------+----------++ RIGHT VENTRICLE           +---------------+----------++ RV S prime:    11.30 cm/s +---------------+----------++ TAPSE (M-mode):2.3 cm     +---------------+----------++  +-------------+-------++-----------++ LEFT ATRIUM         Index       +-------------+-------++-----------++ LA diam:     3.90 cm1.87 cm/m  +-------------+-------++-----------++ LA Vol (A2C):83.0 ml39.70 ml/m +-------------+-------++-----------++ LA Vol (A4C):65.7 ml31.43 ml/m +-------------+-------++-----------++ +------------+---------++-----------++ RIGHT ATRIUM         Index       +------------+---------++-----------++ RA Area:    16.60 cm            +------------+---------++-----------++ RA Volume:  46.00 ml 22.00 ml/m +------------+---------++-----------++ +------------+-----------++ AORTIC VALVE            +------------+-----------++ LVOT Vmax:  122.00 cm/s +------------+-----------++ LVOT Vmean: 71.800 cm/s +------------+-----------++ LVOT VTI:   0.223 m     +------------+-----------++  +-------------+-------++ AORTA                +-------------+-------++ Ao Root diam:3.60 cm +-------------+-------++ Ao Asc diam: 3.60  cm +-------------+-------++  +---------------+-----------++ TRICUSPID VALVE            +---------------+-----------++ TR Peak grad:  14.4 mmHg   +---------------+-----------++ TR Vmax:       205.00 cm/s +---------------+-----------++  +-------------+------+ SHUNTS              +-------------+------+ Systemic VTI:0.22 m +-------------+------+  +---------+-------+ IVC              +---------+-------+ IVC diam:1.40 cm +---------+-------+   Redell Leiter MD Electronically signed by Redell Leiter MD Signature Date/Time: 01/18/2019/12:43:15 PM    Final          ______________________________________________________________________________________________     Recent labs cholesterol HDL 3 6 non-HDL cholesterol 88 open 17.2 creatinine EKG Interpretation  Date/Time:  Tuesday February 13 2024 15:52:07 EDT Ventricular Rate:  51 PR Interval:  198 QRS Duration:  148 QT Interval:  430 QTC Calculation: 396 R Axis:   101  Text Interpretation: Sinus bradycardia Rightward axis Left bundle branch block When compared with ECG of 09-Feb-2023 13:19, unchanged Confirmed by Monetta Rogue ([id number]) on 02/13/2024 4:14:58 PM    Physical Exam:    VS:  BP (!) 140/58   Pulse (!) 51   Ht 5' 11 (1.803 m)   Wt 196 lb 12.8 oz (89.3 kg)   SpO2 96%   BMI 27.45 kg/m     Wt Readings from Last 3 Encounters:  02/13/24 196 lb 12.8 oz (89.3 kg)  02/09/23 196 lb (88.9 kg)  11/26/20 202 lb (91.6 kg)     GEN:  Well nourished, well developed in no acute distress HEENT: Normal NECK: No JVD; No carotid bruits LYMPHATICS: No lymphadenopathy CARDIAC: RRR, no murmurs, rubs, gallops RESPIRATORY:  Clear to auscultation without rales, wheezing or rhonchi  ABDOMEN: Soft, non-tender, non-distended MUSCULOSKELETAL:  No edema; No deformity  SKIN: Warm and dry NEUROLOGIC:  Alert and oriented x 3 PSYCHIATRIC:  Normal affect    Signed, Rogue Monetta, MD  02/13/2024 5:05 PM     [facility]

## 2024-02-13 ENCOUNTER — Ambulatory Visit: Attending: Cardiology | Admitting: Cardiology

## 2024-02-13 ENCOUNTER — Encounter: Payer: Self-pay | Admitting: Cardiology

## 2024-02-13 VITALS — BP 140/58 | HR 51 | Ht 71.0 in | Wt 196.8 lb

## 2024-02-13 DIAGNOSIS — I6523 Occlusion and stenosis of bilateral carotid arteries: Secondary | ICD-10-CM

## 2024-02-13 DIAGNOSIS — I10 Essential (primary) hypertension: Secondary | ICD-10-CM | POA: Diagnosis not present

## 2024-02-13 DIAGNOSIS — E782 Mixed hyperlipidemia: Secondary | ICD-10-CM

## 2024-02-13 DIAGNOSIS — I447 Left bundle-branch block, unspecified: Secondary | ICD-10-CM

## 2024-02-13 NOTE — Patient Instructions (Signed)

## 2024-02-15 DIAGNOSIS — K080 Exfoliation of teeth due to systemic causes: Secondary | ICD-10-CM | POA: Diagnosis not present

## 2024-03-04 DIAGNOSIS — R972 Elevated prostate specific antigen [PSA]: Secondary | ICD-10-CM | POA: Diagnosis not present

## 2024-03-04 DIAGNOSIS — N401 Enlarged prostate with lower urinary tract symptoms: Secondary | ICD-10-CM | POA: Diagnosis not present

## 2024-06-04 DIAGNOSIS — Z131 Encounter for screening for diabetes mellitus: Secondary | ICD-10-CM | POA: Diagnosis not present

## 2024-06-04 DIAGNOSIS — I10 Essential (primary) hypertension: Secondary | ICD-10-CM | POA: Diagnosis not present

## 2024-06-04 DIAGNOSIS — G7000 Myasthenia gravis without (acute) exacerbation: Secondary | ICD-10-CM | POA: Diagnosis not present

## 2024-06-04 DIAGNOSIS — E782 Mixed hyperlipidemia: Secondary | ICD-10-CM | POA: Diagnosis not present

## 2024-06-04 DIAGNOSIS — Z0000 Encounter for general adult medical examination without abnormal findings: Secondary | ICD-10-CM | POA: Diagnosis not present

## 2024-07-11 ENCOUNTER — Other Ambulatory Visit (HOSPITAL_BASED_OUTPATIENT_CLINIC_OR_DEPARTMENT_OTHER): Payer: Self-pay | Admitting: Nurse Practitioner

## 2024-07-12 ENCOUNTER — Ambulatory Visit (HOSPITAL_BASED_OUTPATIENT_CLINIC_OR_DEPARTMENT_OTHER)
Admission: RE | Admit: 2024-07-12 | Discharge: 2024-07-12 | Disposition: A | Source: Ambulatory Visit | Attending: Nurse Practitioner | Admitting: Radiology

## 2024-07-12 DIAGNOSIS — Z13820 Encounter for screening for osteoporosis: Secondary | ICD-10-CM
